# Patient Record
Sex: Male | Born: 1960 | Race: White | Smoking: Never smoker
Health system: Southern US, Community
[De-identification: ages and names within clinical notes are randomized; demographics above are authoritative.]

## PROBLEM LIST (undated history)

## (undated) DIAGNOSIS — I10 Essential (primary) hypertension: Secondary | ICD-10-CM

---

## 2013-08-12 ENCOUNTER — Encounter (HOSPITAL_COMMUNITY): Payer: Self-pay | Admitting: Emergency Medicine

## 2013-08-12 ENCOUNTER — Emergency Department (HOSPITAL_COMMUNITY): Payer: Federal, State, Local not specified - PPO

## 2013-08-12 DIAGNOSIS — A879 Viral meningitis, unspecified: Secondary | ICD-10-CM | POA: Insufficient documentation

## 2013-08-12 DIAGNOSIS — M542 Cervicalgia: Secondary | ICD-10-CM | POA: Insufficient documentation

## 2013-08-12 DIAGNOSIS — I10 Essential (primary) hypertension: Secondary | ICD-10-CM | POA: Insufficient documentation

## 2013-08-12 DIAGNOSIS — H539 Unspecified visual disturbance: Secondary | ICD-10-CM | POA: Insufficient documentation

## 2013-08-12 DIAGNOSIS — IMO0001 Reserved for inherently not codable concepts without codable children: Secondary | ICD-10-CM | POA: Insufficient documentation

## 2013-08-12 DIAGNOSIS — H5500 Unspecified nystagmus: Secondary | ICD-10-CM | POA: Insufficient documentation

## 2013-08-12 DIAGNOSIS — R42 Dizziness and giddiness: Secondary | ICD-10-CM | POA: Insufficient documentation

## 2013-08-12 NOTE — ED Notes (Signed)
Pt reports pain when he bends his neck forward to touch his chin to his chest. Describes the pain shooting from neck to base of posterior head.

## 2013-08-12 NOTE — ED Notes (Signed)
Pt c/o headaches, night sweats, flu light symptoms, given doxycycline for possible puenomonia by PCP, started day before yesterday. Pt reports no relief from medications. Headaches are worse, pt reports dizziness. Pt is A&Ox4, respirartions equal and unlabored, skin warm and dry. Pt denies any recent travel or being around anybody who has recently traveled outside [place].

## 2013-08-13 ENCOUNTER — Emergency Department (HOSPITAL_COMMUNITY): Payer: Federal, State, Local not specified - PPO

## 2013-08-13 ENCOUNTER — Emergency Department (HOSPITAL_COMMUNITY)
Admission: EM | Admit: 2013-08-13 | Discharge: 2013-08-13 | Disposition: A | Discharge status: Home / Self Care | Payer: Federal, State, Local not specified - PPO | Attending: Emergency Medicine | Admitting: Emergency Medicine

## 2013-08-13 DIAGNOSIS — A879 Viral meningitis, unspecified: Secondary | ICD-10-CM

## 2013-08-13 HISTORY — DX: Essential (primary) hypertension: I10

## 2013-08-13 LAB — CBC WITH DIFFERENTIAL/PLATELET
BASOS ABS: 0.1 10*3/uL (ref 0.0–0.1)
BASOS PCT: 1 % (ref 0–1)
Basophils Absolute: 0 10*3/uL (ref 0.0–0.1)
Basophils Relative: 0 % (ref 0–1)
EOS ABS: 0.1 10*3/uL (ref 0.0–0.7)
Eosinophils Absolute: 0.2 10*3/uL (ref 0.0–0.7)
Eosinophils Relative: 1 % (ref 0–5)
Eosinophils Relative: 2 % (ref 0–5)
HCT: 46.9 % (ref 39.0–52.0)
HCT: 47 % (ref 39.0–52.0)
Hemoglobin: 17.2 g/dL — ABNORMAL HIGH (ref 13.0–17.0)
Hemoglobin: 16.9 g/dL (ref 13.0–17.0)
LYMPHS ABS: 2.4 10*3/uL (ref 0.7–4.0)
LYMPHS PCT: 26 % (ref 12–46)
Lymphocytes Relative: 25 % (ref 12–46)
Lymphs Abs: 2.3 10*3/uL (ref 0.7–4.0)
MCH: 33.3 pg (ref 26.0–34.0)
MCH: 32.3 pg (ref 26.0–34.0)
MCHC: 36.7 g/dL — AB (ref 30.0–36.0)
MCHC: 36 g/dL (ref 30.0–36.0)
MCV: 90.7 fL (ref 78.0–100.0)
MCV: 89.9 fL (ref 78.0–100.0)
Monocytes Absolute: 0.7 10*3/uL (ref 0.1–1.0)
Monocytes Absolute: 0.8 10*3/uL (ref 0.1–1.0)
Monocytes Relative: 8 % (ref 3–12)
Monocytes Relative: 9 % (ref 3–12)
NEUTROS ABS: 5.5 10*3/uL (ref 1.7–7.7)
NEUTROS PCT: 65 % (ref 43–77)
NEUTROS PCT: 63 % (ref 43–77)
Neutro Abs: 5.9 10*3/uL (ref 1.7–7.7)
PLATELETS: 258 10*3/uL (ref 150–400)
Platelets: 278 10*3/uL (ref 150–400)
RBC: 5.17 MIL/uL (ref 4.22–5.81)
RBC: 5.23 MIL/uL (ref 4.22–5.81)
RDW: 12.5 % (ref 11.5–15.5)
RDW: 12.5 % (ref 11.5–15.5)
WBC: 9 10*3/uL (ref 4.0–10.5)
WBC: 8.9 10*3/uL (ref 4.0–10.5)

## 2013-08-13 LAB — COMPREHENSIVE METABOLIC PANEL
ALT: 21 U/L (ref 0–53)
AST: 17 U/L (ref 0–37)
Albumin: 3.4 g/dL — ABNORMAL LOW (ref 3.5–5.2)
Alkaline Phosphatase: 41 U/L (ref 39–117)
BUN: 22 mg/dL (ref 6–23)
CHLORIDE: 103 meq/L (ref 96–112)
CO2: 27 mEq/L (ref 19–32)
Calcium: 8.9 mg/dL (ref 8.4–10.5)
Creatinine, Ser: 1.04 mg/dL (ref 0.50–1.35)
GFR calc Af Amer: >90 mL/min (ref >90)
GFR, EST NON AFRICAN AMERICAN: 81 mL/min — AB (ref >90)
GLUCOSE: 176 mg/dL — AB (ref 70–99)
Potassium: 3.6 mEq/L — ABNORMAL LOW (ref 3.7–5.3)
SODIUM: 144 meq/L (ref 137–147)
Total Bilirubin: 0.3 mg/dL (ref 0.3–1.2)
Total Protein: 6.5 g/dL (ref 6.0–8.3)

## 2013-08-13 LAB — BASIC METABOLIC PANEL
BUN: 23 mg/dL (ref 6–23)
CO2: 26 mEq/L (ref 19–32)
Calcium: 9 mg/dL (ref 8.4–10.5)
Chloride: 105 mEq/L (ref 96–112)
Creatinine, Ser: 1 mg/dL (ref 0.50–1.35)
GFR calc Af Amer: >90 mL/min (ref >90)
GFR calc non Af Amer: 85 mL/min — ABNORMAL LOW (ref >90)
GLUCOSE: 119 mg/dL — AB (ref 70–99)
POTASSIUM: 3.8 meq/L (ref 3.7–5.3)
SODIUM: 146 meq/L (ref 137–147)

## 2013-08-13 LAB — D-DIMER, QUANTITATIVE: D-Dimer, Quant: 0.27 ug/mL-FEU (ref 0.00–0.48)

## 2013-08-13 MED ORDER — TRAMADOL HCL 50 MG PO TABS: 50.0000 mg | ORAL_TABLET | Freq: Four times a day (QID) | ORAL | Status: AC | PRN

## 2013-08-13 MED ORDER — OXYCODONE-ACETAMINOPHEN 5-325 MG PO TABS
1.0000 | ORAL_TABLET | ORAL | Status: DC | PRN
  Administered 2013-08-13: 1 via ORAL
  Filled 2013-08-13: qty 1

## 2013-08-13 MED ORDER — SODIUM CHLORIDE 0.9 % IV SOLN
Freq: Once | INTRAVENOUS | Status: AC
  Administered 2013-08-13: 07:00:00 via INTRAVENOUS

## 2013-08-13 MED ORDER — MORPHINE SULFATE 4 MG/ML IJ SOLN
4.0000 mg | Freq: Once | INTRAMUSCULAR | Status: AC
  Administered 2013-08-13: 4 mg via INTRAVENOUS
  Filled 2013-08-13: qty 1

## 2013-08-13 NOTE — Discharge Instructions (Signed)
We suspect that you have a viral meningitis. All the labs and imaging are normal - and show no bleeding or strokes. Please read the material provided. Return to the ER if the symptoms are getting worse, there is increased confusion, neurologic complains.   Viral Meningitis Meningitis is an inflammation of the fluid and membranes surrounding the spinal cord and brain. Viral meningitis is caused by a viral infection. It is important to know whether a virus or bacterium is causing your meningitis. Viral meningitis is generally less severe than bacterial meningitis. Aseptic meningitis is any meningitis not caused by a bacterial infection, including viral meningitis. Meningitis can also be caused by fungi, parasites, certain medicines, cancer, and autoimmune disorders. Uncomplicated meningitis usually resolves on its own in 7 to 10 days.However, there can be complicated cases that last much longer. People with lowered immune systems are at greater risk for poor outcomes from meningitis.It is important to get treatment as soon as possible to minimize the impact of a meningitis infection. Long-term complications could include seizures, hearing loss, weakness, paralysis, blindness, or cognitive impairment. CAUSES Most cases of meningitis are due to viruses. Viruses that cause meningitis include enteroviruses (such as polio and coxsackie viruses), herpes simplex virus, varicella zoster virus, mumps, and HIV. SYMPTOMS  Symptoms can develop over many hours. They may even take a few days to develop. Common symptoms of meningitis in people over the age of 2 years include:  High fever.  Headache.  Stiff neck.  Irritability.  Nausea and vomiting.  Fatigue.  Discomfort from exposure to light.  Discomfort from exposure to loud noise.  Trouble walking.  Altered mental status.  Seizures. DIAGNOSIS  Early diagnosis and treatment are very important. If you have symptoms, you should see your  caregiver right away. Your evaluation may include lab tests of your blood and spinal fluid. A spinal fluid sample is taken through a procedure called a lumbar puncture or spinal tap. During the procedure, a needle is inserted into an area in the lower back. You may also have a CT scan of your brain as part of your evaluation.If there is suspicion of an infection or inflammation of the brain (encephalitis), an MRI scan may be done. TREATMENT   Antibiotics are not effective against a virus. Antiviral medicines may be used depending on the cause of your meningitis.  Treatment for viral meningitis focuses on reducing your symptoms. This may include rest, hydration, and medicines to reduce fever and pain.  Steroids may also be used if there is significant swelling of the brain. PREVENTION  Vaccines can help prevent meningitis caused by polio, measles, and mumps.  Strict hand washing is effective in controlling the spread of many causes of meningitis.  Using barrier protection during sexual intercourse can prevent the spread of meningitis caused by viruses such as the herpes virus or HIV.  Protection from mosquitoes, especially for the very young and very old, can prevent some causes of meningitis. HOME CARE INSTRUCTIONS  Rest.  Drink enough water and fluids to keep your urine clear or pale yellow.  Take all medicine as prescribed.  Practice good hygiene to prevent others from getting sick.  Follow up with your caregiver as directed. SEEK IMMEDIATE MEDICAL CARE IF:  You develop dizziness.  You have a very fast heartbeat.  You have difficulty breathing.  You develop confusion.  You have seizures.  You have unstoppable nausea and vomiting.  You develop any worsening symptoms. MAKE SURE YOU:  Understand these instructions.  Will watch your condition.  Will get help right away if you are not doing well or get worse. Document Released: 04/13/2002 Document Revised: 07/16/2011  Document Reviewed: 08/08/2010 [facility] Patient Information 2014 [place], [place].

## 2013-08-13 NOTE — ED Notes (Signed)
Explained delay to patient and family. RN explained that there was a critical patient in the department and that the MD was working as quickly as he can to be in with them.

## 2013-08-13 NOTE — Consult Note (Signed)
Reason for Consult:Headache Referring Physician: Rhunette Croftanavati  CC: Headache  HPI: Ted McalpineMichael Groman is an 53 y.o. male that reports he began to have a headache about 9 days ago with the onset of a viral illness.  The viral illness resolves but the headache has continued and has escalated in severity.  He describes the headache as being occipital and throbbing.  It has been sever at times and he has been unable to go to work for the past 2 days because of the headache.  He does not report any associated nausea, vomiting, photophobia or phonophobia.  He does report that the headache worsens with neck flexion.  He describes awakening this morning and on getting up experienced dizziness, nausea and a feeling as if visual images were waving back and forth.  He was off balance as well.  While laying down these symptoms are much less pronounced.   Patient has a history of migraines.  These headaches are usually frontal and not of the same character.    Past Medical History  Diagnosis Date  . Hypertension     History reviewed. No pertinent past surgical history.  Family history: Ni history of migraines  Social History:  reports that he has never smoked. He does not have any smokeless tobacco history on file. He reports that he drinks alcohol. He reports that he does not use illicit drugs.  Allergies  Allergen Reactions  . Aleve [Naproxen Sodium]     Blister rash   . Cephalexin     Blister rash   . Doxycycline     Blister rash   . Levaquin [Levofloxacin In D5w]     Blister rash   . Naproxen     Blister rash   . Prednisone     Blister rash   . Verapamil     Blister rash    Medications: I have reviewed the patient's current medications. Prior to Admission:  Current outpatient prescriptions:rizatriptan (MAXALT) 10 MG tablet, Take 10 mg by mouth daily as needed for migraine. May repeat in 2 hours if needed, Disp: , Rfl: ;  traMADol (ULTRAM) 50 MG tablet, Take 50 mg by mouth every 6 (six) hours as  needed for moderate pain., Disp: , Rfl:   ROS: History obtained from the patient  General ROS: negative for - chills, fatigue, fever, night sweats, weight gain or weight loss Psychological ROS: negative for - behavioral disorder, hallucinations, memory difficulties, mood swings or suicidal ideation Ophthalmic ROS: negative for - blurry vision, double vision, eye pain or loss of vision ENT ROS: negative for - epistaxis, nasal discharge, oral lesions, sore throat, tinnitus or vertigo Allergy and Immunology ROS: negative for - hives or itchy/watery eyes Hematological and Lymphatic ROS: negative for - bleeding problems, bruising or swollen lymph nodes Endocrine ROS: negative for - galactorrhea, hair pattern changes, polydipsia/polyuria or temperature intolerance Respiratory ROS: negative for - cough, hemoptysis, shortness of breath or wheezing Cardiovascular ROS: negative for - chest pain, dyspnea on exertion, edema or irregular heartbeat Gastrointestinal ROS: negative for - abdominal pain, diarrhea, hematemesis, nausea/vomiting or stool incontinence Genito-Urinary ROS: negative for - dysuria, hematuria, incontinence or urinary frequency/urgency Musculoskeletal ROS: negative for - joint swelling or muscular weakness Neurological ROS: as noted in HPI Dermatological ROS: rash in groin area from medications   Physical Examination: Blood pressure 157/87, pulse 61, temperature 98.7 F (37.1 C), temperature source Oral, resp. rate 20, height 5\' 11"  (1.803 m), weight 95.981 kg (211 lb 9.6 oz), SpO2 98.00%.  Neurologic  Examination Mental Status: Alert, oriented, thought content appropriate.  Speech fluent without evidence of aphasia.  Able to follow 3 step commands without difficulty. Cranial Nerves: II: Discs flat bilaterally; Visual fields grossly normal, pupils equal, round, reactive to light and accommodation III,IV, VI: ptosis not present, extra-ocular motions intact bilaterally V,VII: smile  symmetric, facial light touch sensation normal bilaterally VIII: hearing normal bilaterally IX,X: gag reflex present XI: bilateral shoulder shrug XII: midline tongue extension Motor: Right : Upper extremity   5/5    Left:     Upper extremity   5/5  Lower extremity   5/5     Lower extremity   5/5 Tone and bulk:normal tone throughout; no atrophy noted Sensory: Pinprick and light touch intact throughout, bilaterally Deep Tendon Reflexes: 2+ and symmetric throughout Plantars: Right: mute   Left: mute Cerebellar: normal finger-to-nose, normal rapid alternating movements and normal heel-to-shin test Gait: normal gait and station CV: pulses palpable throughout    Laboratory Studies:   Basic Metabolic Panel:  Recent Labs Lab 08/13/13 0250 08/13/13 0452  NA 144 146  K 3.6* 3.8  CL 103 105  CO2 27 26  GLUCOSE 176* 119*  BUN 22 23  CREATININE 1.04 1.00  CALCIUM 8.9 9.0    Liver Function Tests:  Recent Labs Lab 08/13/13 0250  AST 17  ALT 21  ALKPHOS 41  BILITOT 0.3  PROT 6.5  ALBUMIN 3.4*   No results found for this basename: LIPASE, AMYLASE,  in the last 168 hours No results found for this basename: AMMONIA,  in the last 168 hours  CBC:  Recent Labs Lab 08/13/13 0250 08/13/13 0452  WBC 9.0 8.9  NEUTROABS 5.9 5.5  HGB 17.2* 16.9  HCT 46.9 47.0  MCV 90.7 89.9  PLT 258 278    Cardiac Enzymes: No results found for this basename: CKTOTAL, CKMB, CKMBINDEX, TROPONINI,  in the last 168 hours  BNP: No components found with this basename: POCBNP,   CBG: No results found for this basename: GLUCAP,  in the last 168 hours  Microbiology: No results found for this or any previous visit.  Coagulation Studies: No results found for this basename: LABPROT, INR,  in the last 72 hours  Urinalysis: No results found for this basename: COLORURINE, APPERANCEUR, LABSPEC, PHURINE, GLUCOSEU, HGBUR, BILIRUBINUR, KETONESUR, PROTEINUR, UROBILINOGEN, NITRITE, LEUKOCYTESUR,  in  the last 168 hours  Lipid Panel:  No results found for this basename: chol, trig, hdl, cholhdl, vldl, ldlcalc    HgbA1C:  No results found for this basename: HGBA1C    Urine Drug Screen:   No results found for this basename: labopia, cocainscrnur, labbenz, amphetmu, thcu, labbarb    Alcohol Level: No results found for this basename: ETH,  in the last 168 hours   Imaging: Dg Chest 2 View  08/12/2013   CLINICAL DATA:  Cough and chills.  EXAM: CHEST  2 VIEW  COMPARISON:  None.  FINDINGS: Mediastinum and hilar structures are normal. The lungs are clear. No pleural effusion or pneumothorax. Heart size normal. No acute bony abnormality identified.  IMPRESSION: No active cardiopulmonary disease.   Electronically Signed   By: Maisie Fus  Register   On: 08/12/2013 22:01   Ct Head Wo Contrast  08/13/2013   CLINICAL DATA:  Dizziness and neck pain.  Headache.  EXAM: CT HEAD WITHOUT CONTRAST  TECHNIQUE: Contiguous axial images were obtained from the base of the skull through the vertex without intravenous contrast.  COMPARISON:  None.  FINDINGS: Skull and Sinuses:Mild inflammatory  mucosal thickening in the bilateral ethmoid sinuses. No acute effusion.  Orbits: No acute abnormality.  Brain: No evidence of acute abnormality, such as acute infarction, hemorrhage, hydrocephalus, or mass lesion/mass effect. Asymmetric lateral ventricular volume, likely developmental.  IMPRESSION: No acute intracranial abnormality.   Electronically Signed   By: Tiburcio Pea M.D.   On: 08/13/2013 05:19    Assessment/Plan: 53 year old male presenting with an atypical headache and accompanying symptoms today of dizziness, gait instability and blurry vision.  Head CT reviewed and shows no acute changes.  Lab work is unremarkable.  Symptoms began with a viral illness and likely represent a viral meningitis.  Will rule out other more worrisome causes with further imaging.  Discussed the option of an LP as well but with the likely  outcome being symptomatic treatment only since it is clear the patient does not have a bacterial meningitis or bleed at this point, we will defer that at this time.    Recommendations: 1.  MRI/MRA of the brain.  Based on results will make further recommendations at that time.  Thana Farr, MD [facility] [phone] 08/13/2013, 8:09 AM    Addendum: MRI of brain reviewed and shows no abnormalities.  MRA reviewed as well and shows no evidence of aneurysm, dissection or hemodynamically significant stenosis.    Recommendation: 1.  Analgesia for home use prn 2.  To f/u with PCP as an outpatient  Case discussed with Dr. Carver Fila, MD [facility] [phone]

## 2013-08-13 NOTE — ED Provider Notes (Signed)
CSN: [id number]     Arrival date & time 08/12/13  2022 History   First MD Initiated Contact with Patient 08/13/13 [phone]     Chief Complaint  Patient presents with  . Headache  . Neck Pain     (Consider location/radiation/quality/duration/timing/severity/associated sxs/prior Treatment) HPI Comments: PT comes in with flu like sx. He has hx of HTN. States that he has been having some uri like sx and cough - for which patient's pcp placed him on doxy. He continues to have some flu like sx, but 3 days ago, developed a headache and some neck pain. The headaches have persisted, and over time gotten worse. Y'day when he woke up, he also appreciates some visual disturbance, and some dizziness. The pain in his head is in the occipital region, and moves down is neck. His symptoms are worse with neck movements - but denies any increased neck rigidity.  Patient is a 53 y.o. male presenting with headaches and neck pain. The history is provided by the patient.  Headache Associated symptoms: myalgias and neck pain   Associated symptoms: no abdominal pain and no cough   Neck Pain Associated symptoms: headaches   Associated symptoms: no chest pain     Past Medical History  Diagnosis Date  . Hypertension    History reviewed. No pertinent past surgical history. History reviewed. No pertinent family history. History  Substance Use Topics  . Smoking status: Never Smoker   . Smokeless tobacco: Not on file  . Alcohol Use: Yes    Review of Systems  Constitutional: Negative for activity change and appetite change.  Eyes: Positive for visual disturbance.  Respiratory: Negative for cough and shortness of breath.   Cardiovascular: Negative for chest pain.  Gastrointestinal: Negative for abdominal pain.  Genitourinary: Negative for dysuria.  Musculoskeletal: Positive for myalgias and neck pain.  Skin: Negative for rash.  Neurological: Positive for headaches.  All other systems reviewed and are  negative.     Allergies  Aleve; Cephalexin; Doxycycline; Levaquin; Naproxen; Prednisone; and Verapamil  Home Medications   Current Outpatient Rx  Name  Route  Sig  Dispense  Refill  . rizatriptan (MAXALT) 10 MG tablet   Oral   Take 10 mg by mouth daily as needed for migraine. May repeat in 2 hours if needed         . traMADol (ULTRAM) 50 MG tablet   Oral   Take 50 mg by mouth every 6 (six) hours as needed for moderate pain.          BP 157/87  Pulse 61  Temp(Src) 98.7 F (37.1 C) (Oral)  Resp 20  Ht 5\' 11"  (1.803 m)  Wt 211 lb 9.6 oz (95.981 kg)  BMI 29.53 kg/m2  SpO2 98% Physical Exam  Nursing note and vitals reviewed. Constitutional: He is oriented to person, place, and time. He appears well-developed.  HENT:  Head: Normocephalic and atraumatic.  Eyes: Conjunctivae and EOM are normal. Pupils are equal, round, and reactive to light.  Nystagmus - with left sided gaze  Neck: Normal range of motion. Neck supple.  No nuchal rigidity  Cardiovascular: Normal rate and regular rhythm.   Pulmonary/Chest: Effort normal and breath sounds normal.  Abdominal: Soft. Bowel sounds are normal. He exhibits no distension. There is no tenderness. There is no rebound and no guarding.  Neurological: He is alert and oriented to person, place, and time.  Cerebellar exam is normal (finger to nose) Sensory exam normal for bilateral upper and  lower extremities - and patient is able to discriminate between sharp and dull. Motor exam is 4+/5 No meningismus appreciated.  Skin: Skin is warm.    ED Course  Procedures (including critical care time) Labs Review Labs Reviewed  COMPREHENSIVE METABOLIC PANEL - Abnormal; Notable for the following:    Potassium 3.6 (*)    Glucose, Bld 176 (*)    Albumin 3.4 (*)    GFR calc non Af Amer 81 (*)    All other components within normal limits  CBC WITH DIFFERENTIAL - Abnormal; Notable for the following:    Hemoglobin 17.2 (*)    MCHC 36.7 (*)     All other components within normal limits  BASIC METABOLIC PANEL - Abnormal; Notable for the following:    Glucose, Bld 119 (*)    GFR calc non Af Amer 85 (*)    All other components within normal limits  CBC WITH DIFFERENTIAL  D-DIMER, QUANTITATIVE   Imaging Review Dg Chest 2 View  08/12/2013   CLINICAL DATA:  Cough and chills.  EXAM: CHEST  2 VIEW  COMPARISON:  None.  FINDINGS: Mediastinum and hilar structures are normal. The lungs are clear. No pleural effusion or pneumothorax. Heart size normal. No acute bony abnormality identified.  IMPRESSION: No active cardiopulmonary disease.   Electronically Signed   By: Maisie Fus  Register   On: 08/12/2013 22:01   Ct Head Wo Contrast  08/13/2013   CLINICAL DATA:  Dizziness and neck pain.  Headache.  EXAM: CT HEAD WITHOUT CONTRAST  TECHNIQUE: Contiguous axial images were obtained from the base of the skull through the vertex without intravenous contrast.  COMPARISON:  None.  FINDINGS: Skull and Sinuses:Mild inflammatory mucosal thickening in the bilateral ethmoid sinuses. No acute effusion.  Orbits: No acute abnormality.  Brain: No evidence of acute abnormality, such as acute infarction, hemorrhage, hydrocephalus, or mass lesion/mass effect. Asymmetric lateral ventricular volume, likely developmental.  IMPRESSION: No acute intracranial abnormality.   Electronically Signed   By: Tiburcio Pea M.D.   On: 08/13/2013 05:19     EKG Interpretation None      MDM   Final diagnoses:  None    DDX includes: Primary headaches - including migrainous headaches, cluster headaches, tension headaches. ICH Carotid dissection Vertebral dissection Cavernous sinus thrombosis Meningitis Encephalitis Sinusitis Tumor Vascular headaches AV malformation Brain aneurysm Muscular headaches  A/P: Pt comes in with cc of headaches, neck pain, flu like sx. No fevers, no true meningismus, and not immunocompromised. No real risk factors for strokes, or  dissection.  With the headaches that are severe, neck pain and some subjective visual disturbance and unsteadiness - ddx includes migraines, vertebral dissection, carotid dissection, brain aneuyrusms.  I am thinking more vascular etiology, given pt had flu like sx prior to his headaches started, and has no fevers, leukocytosis, and is not toxic appearing.   Neurology has been consulted.   8:33 AM Neurology thinks this is possible viral meningitis. They want a MRI/MRA. D/c if normal. Will sign out to Dr. Anitra Lauth.   Derwood Kaplan, MD 08/13/13 [phone]

## 2013-08-13 NOTE — ED Provider Notes (Signed)
MRI/MRA neg and pt neuro exam normal.  Discussed with neuro who recommends d/c and f/u with PCP.  Gwyneth SproutWhitney Havanah Nelms, MD 08/13/13 1255

## 2015-09-29 IMAGING — CR DG CHEST 2V
2 series · 2 of 2 positions shown · non-contrast
Comparison: None.

CLINICAL DATA: Cough and chills.

EXAM:
CHEST  2 VIEW

[w chest pa]
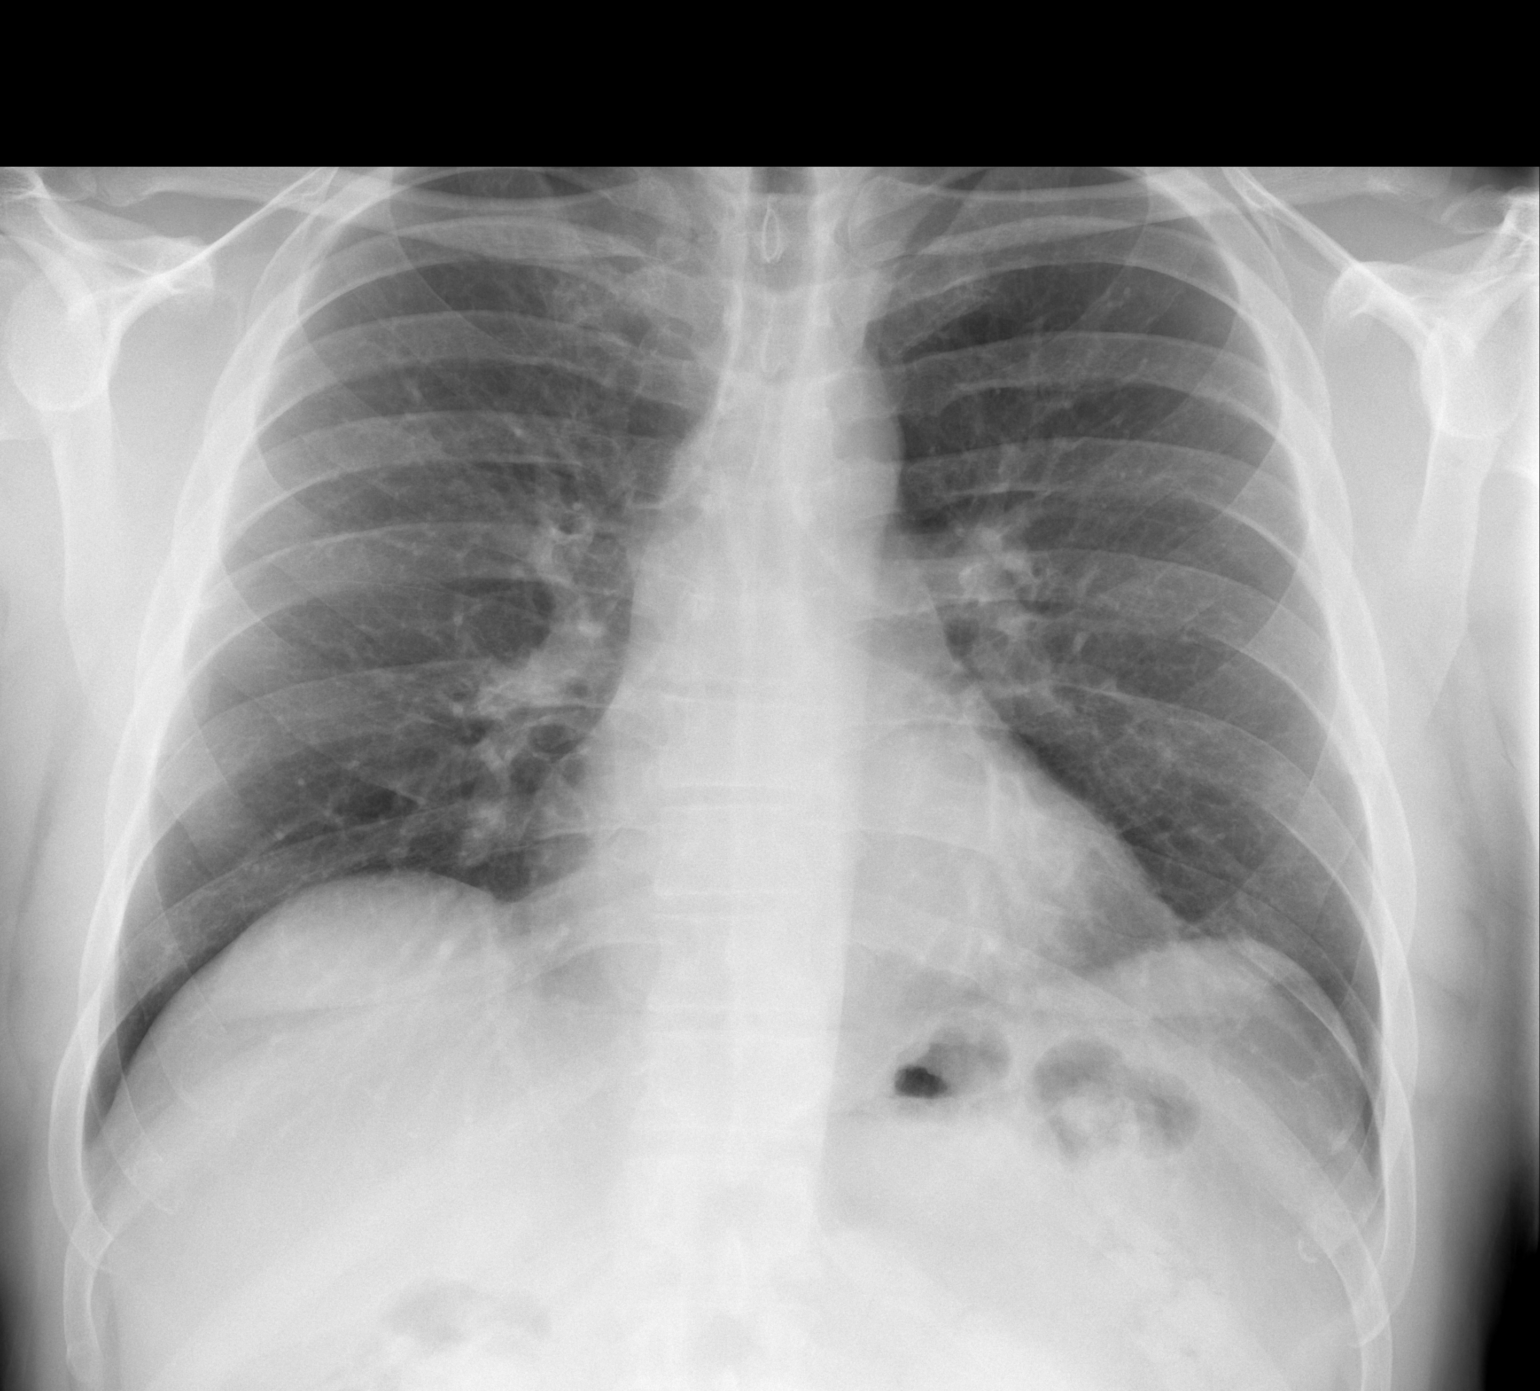

[w chest lat]
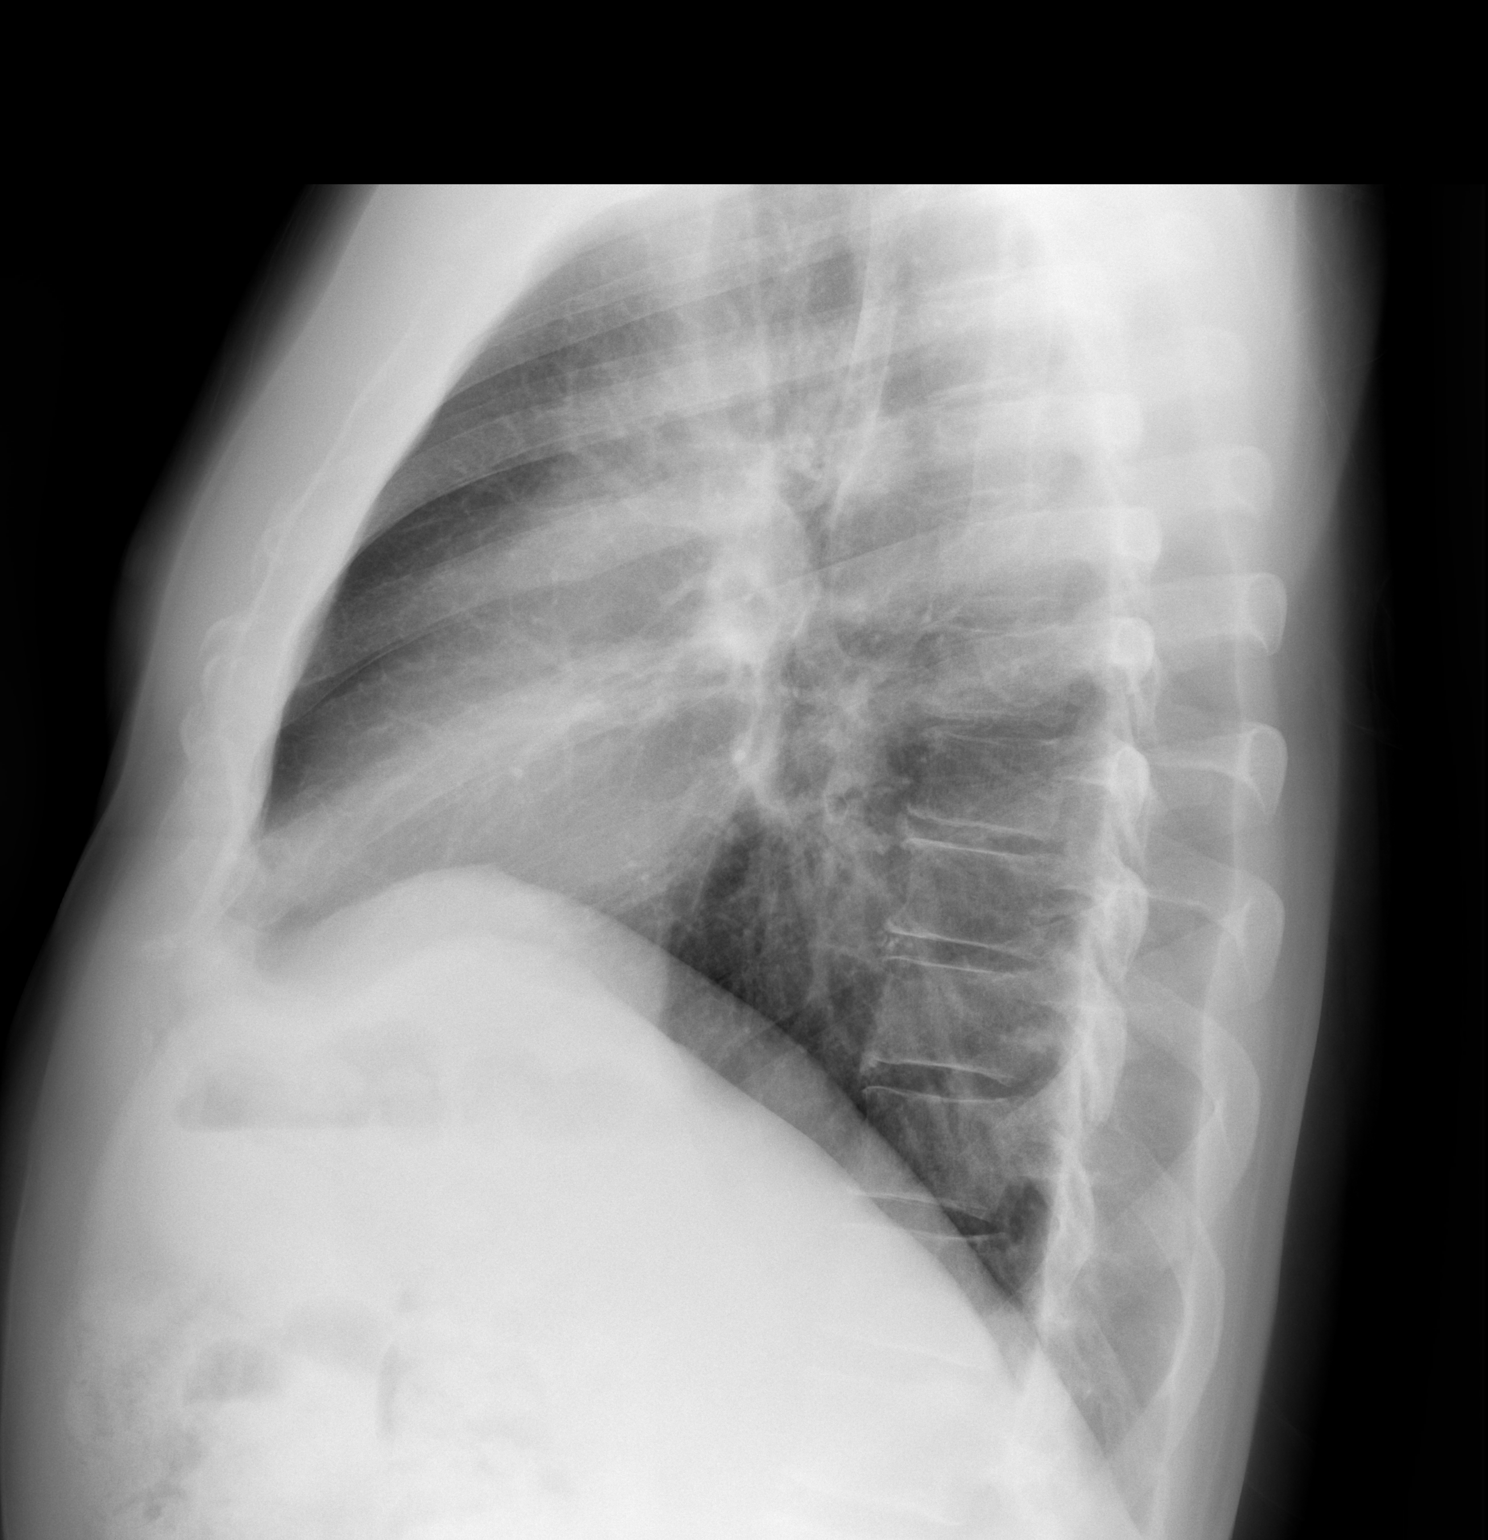

[2 of 2 positions shown; findings below may reference images not displayed]

FINDINGS: Mediastinum and hilar structures are normal. The lungs are clear. No
pleural effusion or pneumothorax. Heart size normal. No acute bony
abnormality identified.
IMPRESSION: No active cardiopulmonary disease.
